# Patient Record
Sex: Male | Born: 1965 | Race: White | Smoking: Never smoker
Health system: Southern US, Community
[De-identification: ages and names within clinical notes are randomized; demographics above are authoritative.]

## PROBLEM LIST (undated history)

## (undated) DIAGNOSIS — E7800 Pure hypercholesterolemia, unspecified: Secondary | ICD-10-CM

## (undated) DIAGNOSIS — I10 Essential (primary) hypertension: Secondary | ICD-10-CM

## (undated) DIAGNOSIS — L409 Psoriasis, unspecified: Secondary | ICD-10-CM

## (undated) DIAGNOSIS — M48061 Spinal stenosis, lumbar region without neurogenic claudication: Secondary | ICD-10-CM

## (undated) HISTORY — PX: KNEE SURGERY: SHX244

## (undated) HISTORY — PX: TONSILLECTOMY: SUR1361

---

## 2016-10-15 DIAGNOSIS — Z23 Encounter for immunization: Secondary | ICD-10-CM | POA: Diagnosis not present

## 2016-10-15 DIAGNOSIS — Z13220 Encounter for screening for lipoid disorders: Secondary | ICD-10-CM | POA: Diagnosis not present

## 2016-10-15 DIAGNOSIS — I10 Essential (primary) hypertension: Secondary | ICD-10-CM | POA: Diagnosis not present

## 2016-10-15 DIAGNOSIS — Z0000 Encounter for general adult medical examination without abnormal findings: Secondary | ICD-10-CM | POA: Diagnosis not present

## 2016-10-15 DIAGNOSIS — Z125 Encounter for screening for malignant neoplasm of prostate: Secondary | ICD-10-CM | POA: Diagnosis not present

## 2016-10-22 DIAGNOSIS — H6123 Impacted cerumen, bilateral: Secondary | ICD-10-CM | POA: Diagnosis not present

## 2016-10-25 DIAGNOSIS — H524 Presbyopia: Secondary | ICD-10-CM | POA: Diagnosis not present

## 2016-12-19 DIAGNOSIS — L400 Psoriasis vulgaris: Secondary | ICD-10-CM | POA: Diagnosis not present

## 2016-12-19 DIAGNOSIS — D1801 Hemangioma of skin and subcutaneous tissue: Secondary | ICD-10-CM | POA: Diagnosis not present

## 2016-12-19 DIAGNOSIS — L570 Actinic keratosis: Secondary | ICD-10-CM | POA: Diagnosis not present

## 2016-12-19 DIAGNOSIS — D225 Melanocytic nevi of trunk: Secondary | ICD-10-CM | POA: Diagnosis not present

## 2016-12-19 DIAGNOSIS — L821 Other seborrheic keratosis: Secondary | ICD-10-CM | POA: Diagnosis not present

## 2017-07-29 DIAGNOSIS — Z1211 Encounter for screening for malignant neoplasm of colon: Secondary | ICD-10-CM | POA: Diagnosis not present

## 2017-08-14 DIAGNOSIS — Z125 Encounter for screening for malignant neoplasm of prostate: Secondary | ICD-10-CM | POA: Diagnosis not present

## 2017-08-14 DIAGNOSIS — I10 Essential (primary) hypertension: Secondary | ICD-10-CM | POA: Diagnosis not present

## 2017-08-14 DIAGNOSIS — J309 Allergic rhinitis, unspecified: Secondary | ICD-10-CM | POA: Diagnosis not present

## 2017-08-14 DIAGNOSIS — E782 Mixed hyperlipidemia: Secondary | ICD-10-CM | POA: Diagnosis not present

## 2017-08-14 DIAGNOSIS — L409 Psoriasis, unspecified: Secondary | ICD-10-CM | POA: Diagnosis not present

## 2017-08-20 DIAGNOSIS — D124 Benign neoplasm of descending colon: Secondary | ICD-10-CM | POA: Diagnosis not present

## 2017-08-20 DIAGNOSIS — K635 Polyp of colon: Secondary | ICD-10-CM | POA: Diagnosis not present

## 2017-08-20 DIAGNOSIS — Z1211 Encounter for screening for malignant neoplasm of colon: Secondary | ICD-10-CM | POA: Diagnosis not present

## 2017-08-20 DIAGNOSIS — D123 Benign neoplasm of transverse colon: Secondary | ICD-10-CM | POA: Diagnosis not present

## 2017-10-31 DIAGNOSIS — H524 Presbyopia: Secondary | ICD-10-CM | POA: Diagnosis not present

## 2017-11-21 DIAGNOSIS — Z23 Encounter for immunization: Secondary | ICD-10-CM | POA: Diagnosis not present

## 2018-01-24 DIAGNOSIS — L400 Psoriasis vulgaris: Secondary | ICD-10-CM | POA: Diagnosis not present

## 2018-01-24 DIAGNOSIS — D2262 Melanocytic nevi of left upper limb, including shoulder: Secondary | ICD-10-CM | POA: Diagnosis not present

## 2018-01-24 DIAGNOSIS — D2261 Melanocytic nevi of right upper limb, including shoulder: Secondary | ICD-10-CM | POA: Diagnosis not present

## 2018-01-24 DIAGNOSIS — D225 Melanocytic nevi of trunk: Secondary | ICD-10-CM | POA: Diagnosis not present

## 2018-11-13 DIAGNOSIS — Z125 Encounter for screening for malignant neoplasm of prostate: Secondary | ICD-10-CM | POA: Diagnosis not present

## 2018-11-13 DIAGNOSIS — Z1389 Encounter for screening for other disorder: Secondary | ICD-10-CM | POA: Diagnosis not present

## 2018-11-13 DIAGNOSIS — I10 Essential (primary) hypertension: Secondary | ICD-10-CM | POA: Diagnosis not present

## 2018-11-13 DIAGNOSIS — E782 Mixed hyperlipidemia: Secondary | ICD-10-CM | POA: Diagnosis not present

## 2018-11-13 DIAGNOSIS — Z0000 Encounter for general adult medical examination without abnormal findings: Secondary | ICD-10-CM | POA: Diagnosis not present

## 2018-11-13 DIAGNOSIS — Z23 Encounter for immunization: Secondary | ICD-10-CM | POA: Diagnosis not present

## 2019-02-26 DIAGNOSIS — Z20828 Contact with and (suspected) exposure to other viral communicable diseases: Secondary | ICD-10-CM | POA: Diagnosis not present

## 2019-03-16 DIAGNOSIS — Z23 Encounter for immunization: Secondary | ICD-10-CM | POA: Diagnosis not present

## 2019-03-28 ENCOUNTER — Ambulatory Visit: Payer: Self-pay | Attending: Internal Medicine

## 2019-03-28 ENCOUNTER — Ambulatory Visit: Payer: Self-pay

## 2019-03-28 DIAGNOSIS — Z23 Encounter for immunization: Secondary | ICD-10-CM | POA: Insufficient documentation

## 2019-03-28 NOTE — Progress Notes (Signed)
   Covid-19 Vaccination Clinic  Name:  Herbert Murphy    MRN: [id number] DOB: [birth year]  03/28/2019  [name] was observed post Covid-19 immunization for 15 minutes without incident. He was provided with Vaccine Information Sheet and instruction to access the V-Safe system.   [name] was instructed to call 911 with any severe reactions post vaccine: Marland Kitchen Difficulty breathing  . Swelling of face and throat  . A fast heartbeat  . A bad rash all over body  . Dizziness and weakness   Immunizations Administered    Name Date Dose VIS Date Route   Pfizer COVID-19 Vaccine 03/28/2019  2:58 PM 0.3 mL 01/02/2019 Intramuscular   Manufacturer: [employer], [employer]   Lot: [id number]   NDC: [address]-5

## 2019-04-18 ENCOUNTER — Ambulatory Visit: Payer: Self-pay | Attending: Internal Medicine

## 2019-04-18 DIAGNOSIS — Z23 Encounter for immunization: Secondary | ICD-10-CM

## 2019-04-18 NOTE — Progress Notes (Signed)
   Covid-19 Vaccination Clinic  Name:  Herbert Murphy    MRN: [id number] DOB: 1965-07-14  04/18/2019  [name] was observed post Covid-19 immunization for 15 minutes without incident. He was provided with Vaccine Information Sheet and instruction to access the V-Safe system.   [name] was instructed to call 911 with any severe reactions post vaccine: Marland Kitchen Difficulty breathing  . Swelling of face and throat  . A fast heartbeat  . A bad rash all over body  . Dizziness and weakness   Immunizations Administered    Name Date Dose VIS Date Route   Pfizer COVID-19 Vaccine 04/18/2019  1:09 PM 0.3 mL 01/02/2019 Intramuscular   Manufacturer: [employer], [employer]   Lot: [id number]   NDC: [address]-0

## 2019-04-27 ENCOUNTER — Ambulatory Visit: Payer: Self-pay

## 2019-04-28 ENCOUNTER — Ambulatory Visit: Payer: Self-pay

## 2019-10-01 DIAGNOSIS — D225 Melanocytic nevi of trunk: Secondary | ICD-10-CM | POA: Diagnosis not present

## 2019-10-01 DIAGNOSIS — L409 Psoriasis, unspecified: Secondary | ICD-10-CM | POA: Diagnosis not present

## 2019-10-01 DIAGNOSIS — D2261 Melanocytic nevi of right upper limb, including shoulder: Secondary | ICD-10-CM | POA: Diagnosis not present

## 2019-10-01 DIAGNOSIS — L821 Other seborrheic keratosis: Secondary | ICD-10-CM | POA: Diagnosis not present

## 2019-10-01 DIAGNOSIS — L570 Actinic keratosis: Secondary | ICD-10-CM | POA: Diagnosis not present

## 2019-10-01 DIAGNOSIS — D485 Neoplasm of uncertain behavior of skin: Secondary | ICD-10-CM | POA: Diagnosis not present

## 2019-10-01 DIAGNOSIS — D2262 Melanocytic nevi of left upper limb, including shoulder: Secondary | ICD-10-CM | POA: Diagnosis not present

## 2019-11-16 DIAGNOSIS — J309 Allergic rhinitis, unspecified: Secondary | ICD-10-CM | POA: Diagnosis not present

## 2019-11-16 DIAGNOSIS — Z125 Encounter for screening for malignant neoplasm of prostate: Secondary | ICD-10-CM | POA: Diagnosis not present

## 2019-11-16 DIAGNOSIS — I10 Essential (primary) hypertension: Secondary | ICD-10-CM | POA: Diagnosis not present

## 2019-11-16 DIAGNOSIS — M1990 Unspecified osteoarthritis, unspecified site: Secondary | ICD-10-CM | POA: Diagnosis not present

## 2019-11-16 DIAGNOSIS — R7309 Other abnormal glucose: Secondary | ICD-10-CM | POA: Diagnosis not present

## 2019-11-16 DIAGNOSIS — E782 Mixed hyperlipidemia: Secondary | ICD-10-CM | POA: Diagnosis not present

## 2019-11-16 DIAGNOSIS — Z0000 Encounter for general adult medical examination without abnormal findings: Secondary | ICD-10-CM | POA: Diagnosis not present

## 2020-06-07 DIAGNOSIS — E782 Mixed hyperlipidemia: Secondary | ICD-10-CM | POA: Diagnosis not present

## 2020-06-07 DIAGNOSIS — J309 Allergic rhinitis, unspecified: Secondary | ICD-10-CM | POA: Diagnosis not present

## 2020-06-07 DIAGNOSIS — R7303 Prediabetes: Secondary | ICD-10-CM | POA: Diagnosis not present

## 2020-06-07 DIAGNOSIS — I10 Essential (primary) hypertension: Secondary | ICD-10-CM | POA: Diagnosis not present

## 2020-08-08 DIAGNOSIS — M79671 Pain in right foot: Secondary | ICD-10-CM | POA: Diagnosis not present

## 2020-08-10 DIAGNOSIS — M67872 Other specified disorders of synovium, left ankle and foot: Secondary | ICD-10-CM | POA: Diagnosis not present

## 2020-08-22 DIAGNOSIS — M25572 Pain in left ankle and joints of left foot: Secondary | ICD-10-CM | POA: Diagnosis not present

## 2020-09-29 DIAGNOSIS — L821 Other seborrheic keratosis: Secondary | ICD-10-CM | POA: Diagnosis not present

## 2020-09-29 DIAGNOSIS — L570 Actinic keratosis: Secondary | ICD-10-CM | POA: Diagnosis not present

## 2020-09-29 DIAGNOSIS — D485 Neoplasm of uncertain behavior of skin: Secondary | ICD-10-CM | POA: Diagnosis not present

## 2020-09-29 DIAGNOSIS — L814 Other melanin hyperpigmentation: Secondary | ICD-10-CM | POA: Diagnosis not present

## 2020-09-29 DIAGNOSIS — L400 Psoriasis vulgaris: Secondary | ICD-10-CM | POA: Diagnosis not present

## 2020-09-29 DIAGNOSIS — B079 Viral wart, unspecified: Secondary | ICD-10-CM | POA: Diagnosis not present

## 2020-09-29 DIAGNOSIS — D2272 Melanocytic nevi of left lower limb, including hip: Secondary | ICD-10-CM | POA: Diagnosis not present

## 2020-11-16 DIAGNOSIS — Z125 Encounter for screening for malignant neoplasm of prostate: Secondary | ICD-10-CM | POA: Diagnosis not present

## 2020-11-16 DIAGNOSIS — Z23 Encounter for immunization: Secondary | ICD-10-CM | POA: Diagnosis not present

## 2020-11-16 DIAGNOSIS — L409 Psoriasis, unspecified: Secondary | ICD-10-CM | POA: Diagnosis not present

## 2020-11-16 DIAGNOSIS — I10 Essential (primary) hypertension: Secondary | ICD-10-CM | POA: Diagnosis not present

## 2020-11-16 DIAGNOSIS — Z13220 Encounter for screening for lipoid disorders: Secondary | ICD-10-CM | POA: Diagnosis not present

## 2020-11-16 DIAGNOSIS — Z0000 Encounter for general adult medical examination without abnormal findings: Secondary | ICD-10-CM | POA: Diagnosis not present

## 2020-11-16 DIAGNOSIS — R7303 Prediabetes: Secondary | ICD-10-CM | POA: Diagnosis not present

## 2020-11-16 DIAGNOSIS — J309 Allergic rhinitis, unspecified: Secondary | ICD-10-CM | POA: Diagnosis not present

## 2020-11-16 DIAGNOSIS — E782 Mixed hyperlipidemia: Secondary | ICD-10-CM | POA: Diagnosis not present

## 2020-11-22 ENCOUNTER — Ambulatory Visit
Admission: RE | Admit: 2020-11-22 | Discharge: 2020-11-22 | Disposition: A | Discharge status: Home / Self Care | Payer: BC Managed Care – PPO | Source: Ambulatory Visit | Attending: Internal Medicine | Admitting: Internal Medicine

## 2020-11-22 ENCOUNTER — Other Ambulatory Visit: Payer: Self-pay

## 2020-11-22 ENCOUNTER — Other Ambulatory Visit: Payer: Self-pay | Admitting: Internal Medicine

## 2020-11-22 DIAGNOSIS — M5440 Lumbago with sciatica, unspecified side: Secondary | ICD-10-CM

## 2020-11-22 DIAGNOSIS — M25552 Pain in left hip: Secondary | ICD-10-CM

## 2021-06-13 DIAGNOSIS — I10 Essential (primary) hypertension: Secondary | ICD-10-CM | POA: Diagnosis not present

## 2021-06-13 DIAGNOSIS — R7303 Prediabetes: Secondary | ICD-10-CM | POA: Diagnosis not present

## 2021-06-13 DIAGNOSIS — E782 Mixed hyperlipidemia: Secondary | ICD-10-CM | POA: Diagnosis not present

## 2021-06-13 DIAGNOSIS — L409 Psoriasis, unspecified: Secondary | ICD-10-CM | POA: Diagnosis not present

## 2021-10-05 DIAGNOSIS — L814 Other melanin hyperpigmentation: Secondary | ICD-10-CM | POA: Diagnosis not present

## 2021-10-05 DIAGNOSIS — L400 Psoriasis vulgaris: Secondary | ICD-10-CM | POA: Diagnosis not present

## 2021-10-05 DIAGNOSIS — L570 Actinic keratosis: Secondary | ICD-10-CM | POA: Diagnosis not present

## 2021-10-05 DIAGNOSIS — D2262 Melanocytic nevi of left upper limb, including shoulder: Secondary | ICD-10-CM | POA: Diagnosis not present

## 2021-10-05 DIAGNOSIS — D225 Melanocytic nevi of trunk: Secondary | ICD-10-CM | POA: Diagnosis not present

## 2021-12-21 DIAGNOSIS — R7303 Prediabetes: Secondary | ICD-10-CM | POA: Diagnosis not present

## 2021-12-21 DIAGNOSIS — I10 Essential (primary) hypertension: Secondary | ICD-10-CM | POA: Diagnosis not present

## 2021-12-21 DIAGNOSIS — E782 Mixed hyperlipidemia: Secondary | ICD-10-CM | POA: Diagnosis not present

## 2021-12-21 DIAGNOSIS — Z0000 Encounter for general adult medical examination without abnormal findings: Secondary | ICD-10-CM | POA: Diagnosis not present

## 2022-01-03 DIAGNOSIS — H40013 Open angle with borderline findings, low risk, bilateral: Secondary | ICD-10-CM | POA: Diagnosis not present

## 2022-01-03 DIAGNOSIS — H2513 Age-related nuclear cataract, bilateral: Secondary | ICD-10-CM | POA: Diagnosis not present

## 2022-01-05 DIAGNOSIS — M5416 Radiculopathy, lumbar region: Secondary | ICD-10-CM | POA: Diagnosis not present

## 2022-01-11 DIAGNOSIS — M545 Low back pain, unspecified: Secondary | ICD-10-CM | POA: Diagnosis not present

## 2022-02-02 ENCOUNTER — Ambulatory Visit
Admission: RE | Admit: 2022-02-02 | Discharge: 2022-02-02 | Disposition: A | Discharge status: Home / Self Care | Payer: Self-pay | Source: Ambulatory Visit | Attending: Internal Medicine | Admitting: Internal Medicine

## 2022-02-02 ENCOUNTER — Other Ambulatory Visit: Payer: Self-pay | Admitting: Internal Medicine

## 2022-02-02 DIAGNOSIS — R059 Cough, unspecified: Secondary | ICD-10-CM

## 2022-02-24 ENCOUNTER — Ambulatory Visit
Admission: RE | Admit: 2022-02-24 | Discharge: 2022-02-24 | Disposition: A | Discharge status: Home / Self Care | Payer: 59 | Source: Ambulatory Visit

## 2022-02-24 ENCOUNTER — Ambulatory Visit (INDEPENDENT_AMBULATORY_CARE_PROVIDER_SITE_OTHER): Payer: 59

## 2022-02-24 VITALS — BP 139/78 | HR 85 | Temp 100.5°F | Resp 20

## 2022-02-24 DIAGNOSIS — R059 Cough, unspecified: Secondary | ICD-10-CM

## 2022-02-24 DIAGNOSIS — J018 Other acute sinusitis: Secondary | ICD-10-CM

## 2022-02-24 DIAGNOSIS — R053 Chronic cough: Secondary | ICD-10-CM | POA: Diagnosis not present

## 2022-02-24 HISTORY — DX: Pure hypercholesterolemia, unspecified: E78.00

## 2022-02-24 HISTORY — DX: Essential (primary) hypertension: I10

## 2022-02-24 HISTORY — DX: Psoriasis, unspecified: L40.9

## 2022-02-24 HISTORY — DX: Spinal stenosis, lumbar region without neurogenic claudication: M48.061

## 2022-02-24 MED ORDER — CETIRIZINE HCL 10 MG PO TABS: 10.0000 mg | ORAL_TABLET | Freq: Every day | ORAL | 0 refills | Status: AC

## 2022-02-24 MED ORDER — AMOXICILLIN-POT CLAVULANATE 875-125 MG PO TABS: 1.0000 | ORAL_TABLET | Freq: Two times a day (BID) | ORAL | 0 refills | Status: AC

## 2022-02-24 MED ORDER — PSEUDOEPHEDRINE HCL 60 MG PO TABS: 60.0000 mg | ORAL_TABLET | Freq: Three times a day (TID) | ORAL | 0 refills | Status: AC | PRN

## 2022-02-24 NOTE — ED Provider Notes (Signed)
[facility]  Note:  This document was prepared using Systems analyst and may include unintentional dictation errors.  MRN: [id number] DOB: [birth year]  Subjective:   Herbert Murphy is a 57 y.o. male presenting for 3 week history of persistent coughing, sinus congestion, sinus drainage. No chest pain, shob. Cough can elicit chest discomfort. He was seen by his PCP, the did an x-ray which was negative.  Patient ended up undergoing a course of azithromycin and prednisone.  He did take medications as opposed to his symptoms.  No history of respiratory disorders, asthma.  No current facility-administered medications for this encounter.  Current Outpatient Medications:    atorvastatin (LIPITOR) 10 MG tablet, Atorvastatin, Disp: , Rfl:    lisinopril-hydrochlorothiazide (ZESTORETIC) 20-25 MG tablet, 1/2 tablet Orally Once a day for 90 days, Disp: , Rfl:    Omega-3 Fatty Acids (FISH OIL) 300 MG CAPS, Fish Oil, Disp: , Rfl:    Pediatric Multivitamins-Fl (MULTIVITAMIN + FLUORIDE) 0.25 MG CHEW, Multivitamin, Disp: , Rfl:    No Known Allergies  Past Medical History:  Diagnosis Date   High cholesterol    Hypertension    Psoriasis    Spinal stenosis at L4-L5 level      Past Surgical History:  Procedure Laterality Date   KNEE SURGERY     TONSILLECTOMY      No family history on file.  Social History   Tobacco Use   Smoking status: Never   Smokeless tobacco: Never  Vaping Use   Vaping Use: Never used  Substance Use Topics   Alcohol use: Never   Drug use: Never    ROS   Objective:   Vitals: BP 139/78 (BP Location: Right Arm)   Pulse 85   Temp (!) 100.5 F (38.1 C) (Oral)   Resp 20   SpO2 95%   Physical Exam Constitutional:      General: He is not in acute distress.    Appearance: Normal appearance. He is well-developed and normal weight. He is not ill-appearing, toxic-appearing or diaphoretic.  HENT:     Head: Normocephalic and  atraumatic.     Right Ear: External ear normal.     Left Ear: External ear normal.     Nose: Nose normal.     Mouth/Throat:     Mouth: Mucous membranes are moist.  Eyes:     General: No scleral icterus.       Right eye: No discharge.        Left eye: No discharge.     Extraocular Movements: Extraocular movements intact.  Cardiovascular:     Rate and Rhythm: Normal rate and regular rhythm.     Heart sounds: Normal heart sounds. No murmur heard.    No friction rub. No gallop.  Pulmonary:     Effort: Pulmonary effort is normal. No respiratory distress.     Breath sounds: Normal breath sounds. No stridor. No wheezing, rhonchi or rales.     Comments: Slight coarse lung sounds bilaterally.  Musculoskeletal:     Cervical back: Normal range of motion.  Skin:    General: Skin is warm and dry.  Neurological:     Mental Status: He is alert and oriented to person, place, and time.  Psychiatric:        Mood and Affect: Mood normal.        Behavior: Behavior normal.        Thought Content: Thought content normal.  Judgment: Judgment normal.     DG Chest 2 View  Result Date: 02/24/2022 CLINICAL DATA:  Productive cough EXAM: CHEST - 2 VIEW COMPARISON:  02/02/2022 FINDINGS: Midline trachea.  Normal heart size and mediastinal contours. Sharp costophrenic angles.  No pneumothorax.  Clear lungs. IMPRESSION: No active cardiopulmonary disease. Electronically Signed   By: Abigail Miyamoto M.D.   On: 02/24/2022 14:01     Assessment and Plan :   PDMP not reviewed this encounter.  1. Acute non-recurrent sinusitis of other sinus   2. Persistent cough     Chest x-ray negative for pneumonia second time.  Will address sinusitis with Augmentin.  Recommended supportive care.  Follow-up with PCP as necessary.  Counseled patient on potential for adverse effects with medications prescribed/recommended today, ER and return-to-clinic precautions discussed, patient verbalized understanding.    Jaynee Eagles, [place] 02/24/22 [phone]

## 2022-02-24 NOTE — ED Triage Notes (Signed)
Pt c/o prod cough stared 1/12-was seen by PCP completed steroids and abx-c/o fever this am-last tylenol 40 min PTA-NAD-steady gait

## 2022-02-24 NOTE — Discharge Instructions (Addendum)
Please start Augmentin to address a sinus infection.  Your chest x-ray is negative.  There is no pneumonia.  I do recommend taking Zyrtec and pseudoephedrine as well.

## 2022-09-30 IMAGING — CR DG HIP (WITH OR WITHOUT PELVIS) 2-3V*L*
2 series · 2 of 2 positions shown · non-contrast
Comparison: None.

CLINICAL DATA: Left hip pain.

EXAM:
DG HIP (WITH OR WITHOUT PELVIS) 2-3V LEFT

[t hip ap left]
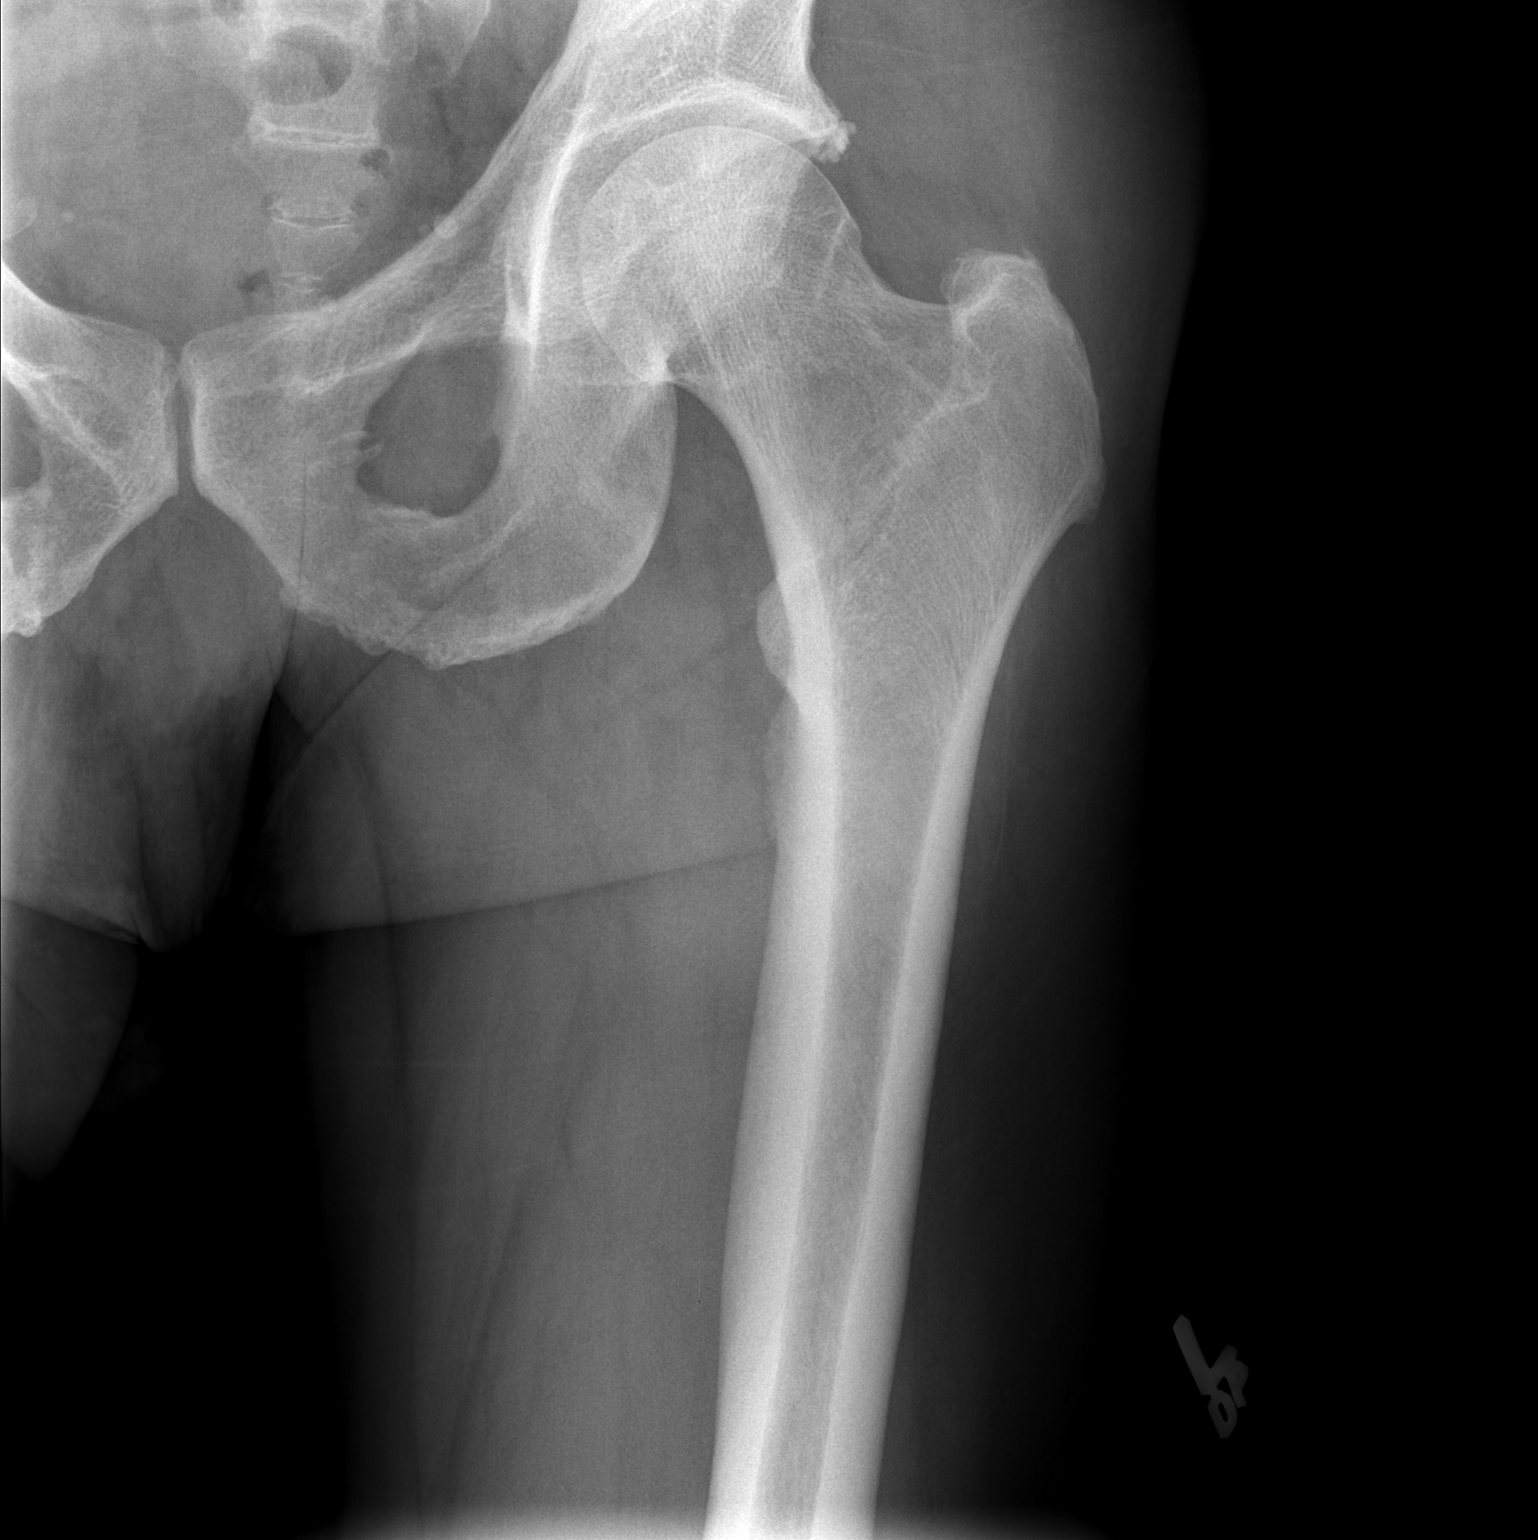

[t hip frog leg left]
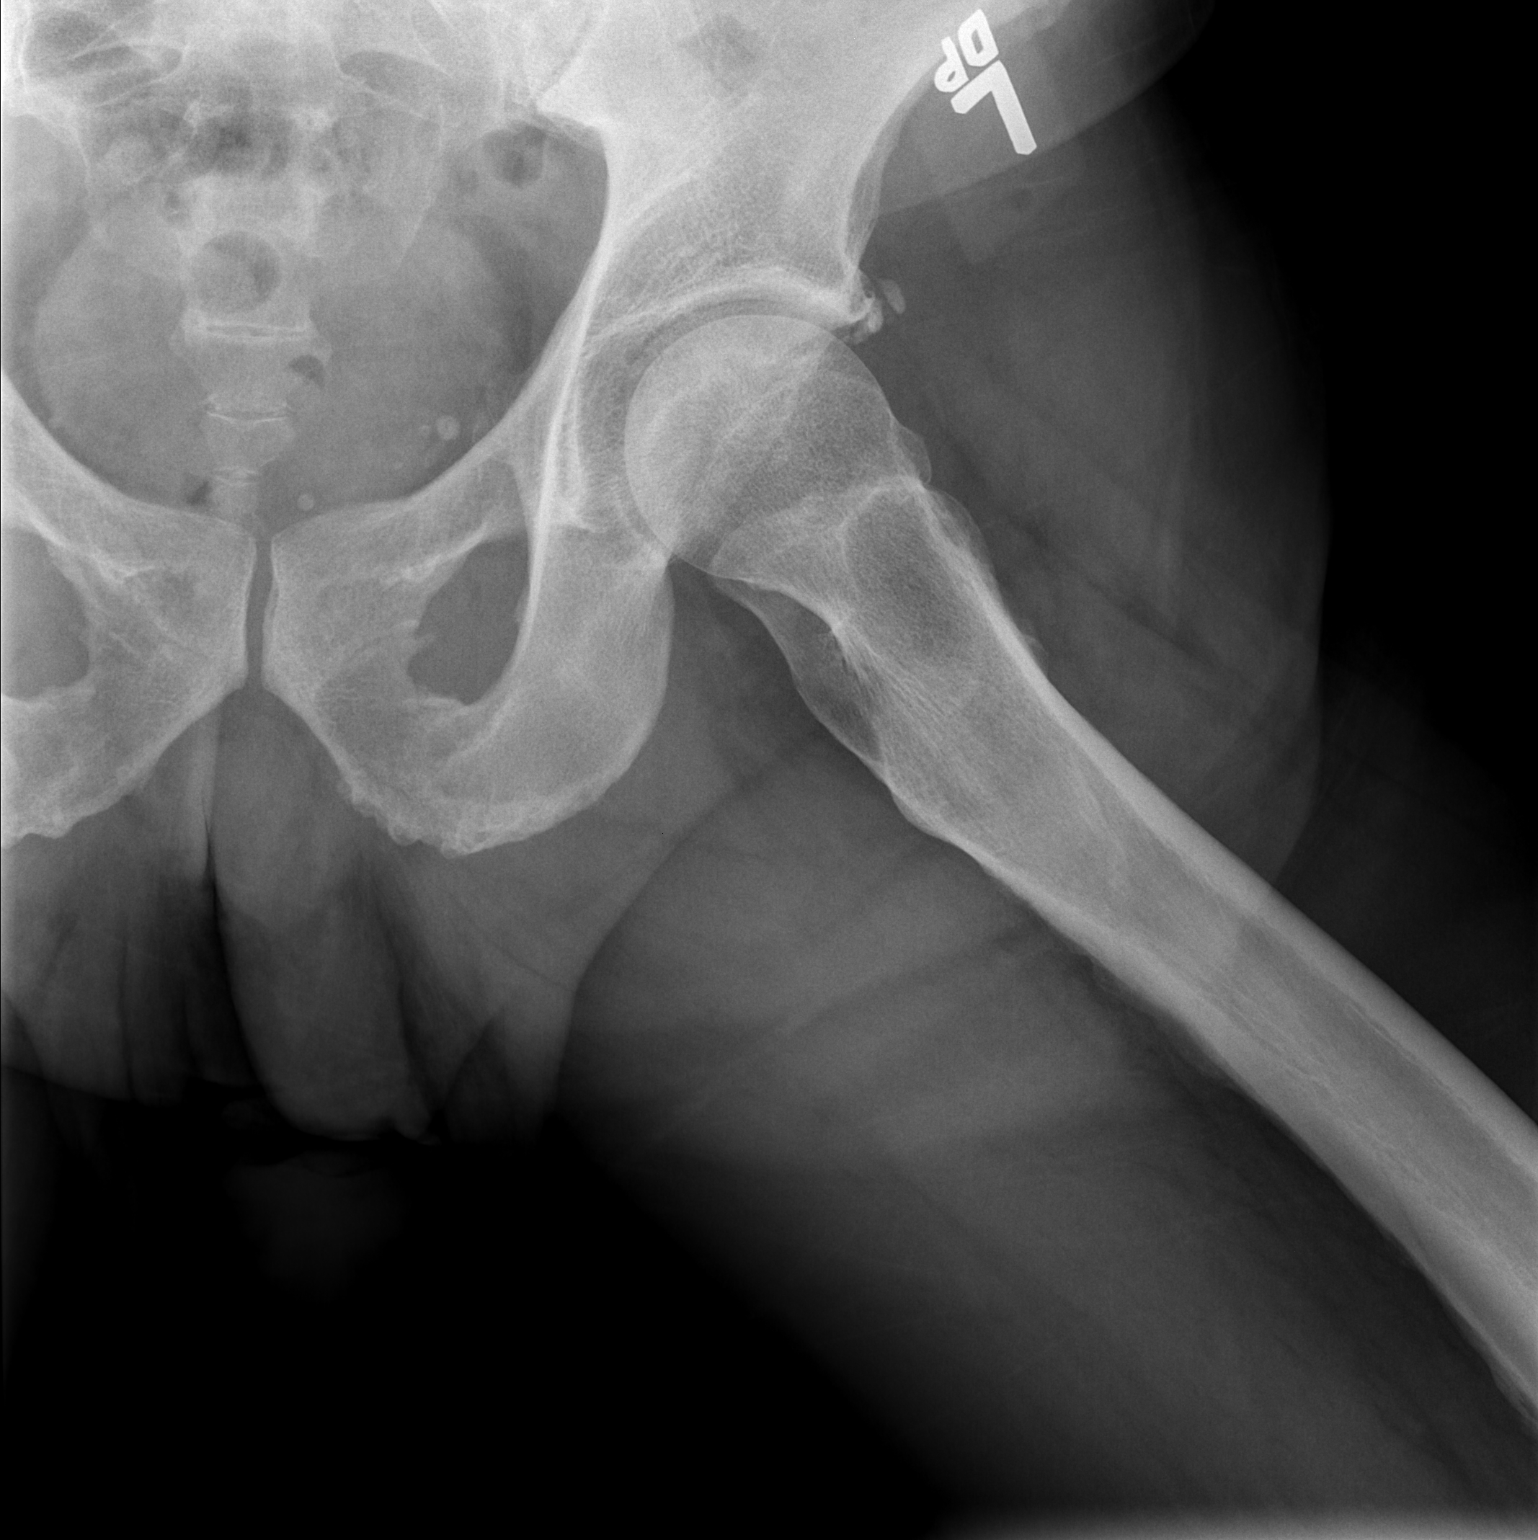

[2 of 2 positions shown; findings below may reference images not displayed]

FINDINGS: There is no evidence of hip fracture or dislocation. Joint spaces
are well maintained. There are minimal degenerative osteophytes of
the superolateral acetabulum. No focal osseous lesions are
identified. Soft tissues are within normal limits.
IMPRESSION: 1. No acute fracture or dislocation.
2. Minimal degenerative changes.
